# Patient Record
Sex: Male | Born: 1990 | Race: White | Hispanic: No | Marital: Married | Smoking: Current every day smoker
Health system: Southern US, Community
[De-identification: ages and names within clinical notes are randomized; demographics above are authoritative.]

## PROBLEM LIST (undated history)

## (undated) HISTORY — PX: TONSILLECTOMY: SUR1361

---
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
## 2013-07-16 ENCOUNTER — Emergency Department (HOSPITAL_BASED_OUTPATIENT_CLINIC_OR_DEPARTMENT_OTHER): Payer: BC Managed Care – PPO

## 2013-07-16 ENCOUNTER — Encounter (HOSPITAL_BASED_OUTPATIENT_CLINIC_OR_DEPARTMENT_OTHER): Payer: Self-pay | Admitting: *Deleted

## 2013-07-16 ENCOUNTER — Emergency Department (HOSPITAL_BASED_OUTPATIENT_CLINIC_OR_DEPARTMENT_OTHER)
Admission: EM | Admit: 2013-07-16 | Discharge: 2013-07-16 | Disposition: A | Discharge status: Home / Self Care | Payer: BC Managed Care – PPO | Attending: Emergency Medicine | Admitting: Emergency Medicine

## 2013-07-16 DIAGNOSIS — F17200 Nicotine dependence, unspecified, uncomplicated: Secondary | ICD-10-CM | POA: Insufficient documentation

## 2013-07-16 DIAGNOSIS — Y929 Unspecified place or not applicable: Secondary | ICD-10-CM | POA: Insufficient documentation

## 2013-07-16 DIAGNOSIS — S62309A Unspecified fracture of unspecified metacarpal bone, initial encounter for closed fracture: Secondary | ICD-10-CM | POA: Insufficient documentation

## 2013-07-16 DIAGNOSIS — W2209XA Striking against other stationary object, initial encounter: Secondary | ICD-10-CM | POA: Insufficient documentation

## 2013-07-16 DIAGNOSIS — Y939 Activity, unspecified: Secondary | ICD-10-CM | POA: Insufficient documentation

## 2013-07-16 MED ORDER — HYDROCODONE-ACETAMINOPHEN 5-325 MG PO TABS: 2.0000 | ORAL_TABLET | ORAL | Status: AC | PRN
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
## 2013-07-16 NOTE — ED Notes (Signed)
Patient hit wall with R hand today swollen, hurts to touch

## 2015-04-04 IMAGING — CR DG HAND COMPLETE 3+V*R*
3 series · 3 of 3 positions shown · non-contrast
Comparison: None

CLINICAL DATA: Right hand injury and pain.

RIGHT HAND - COMPLETE 3+ VIEW

[x hand pa right]
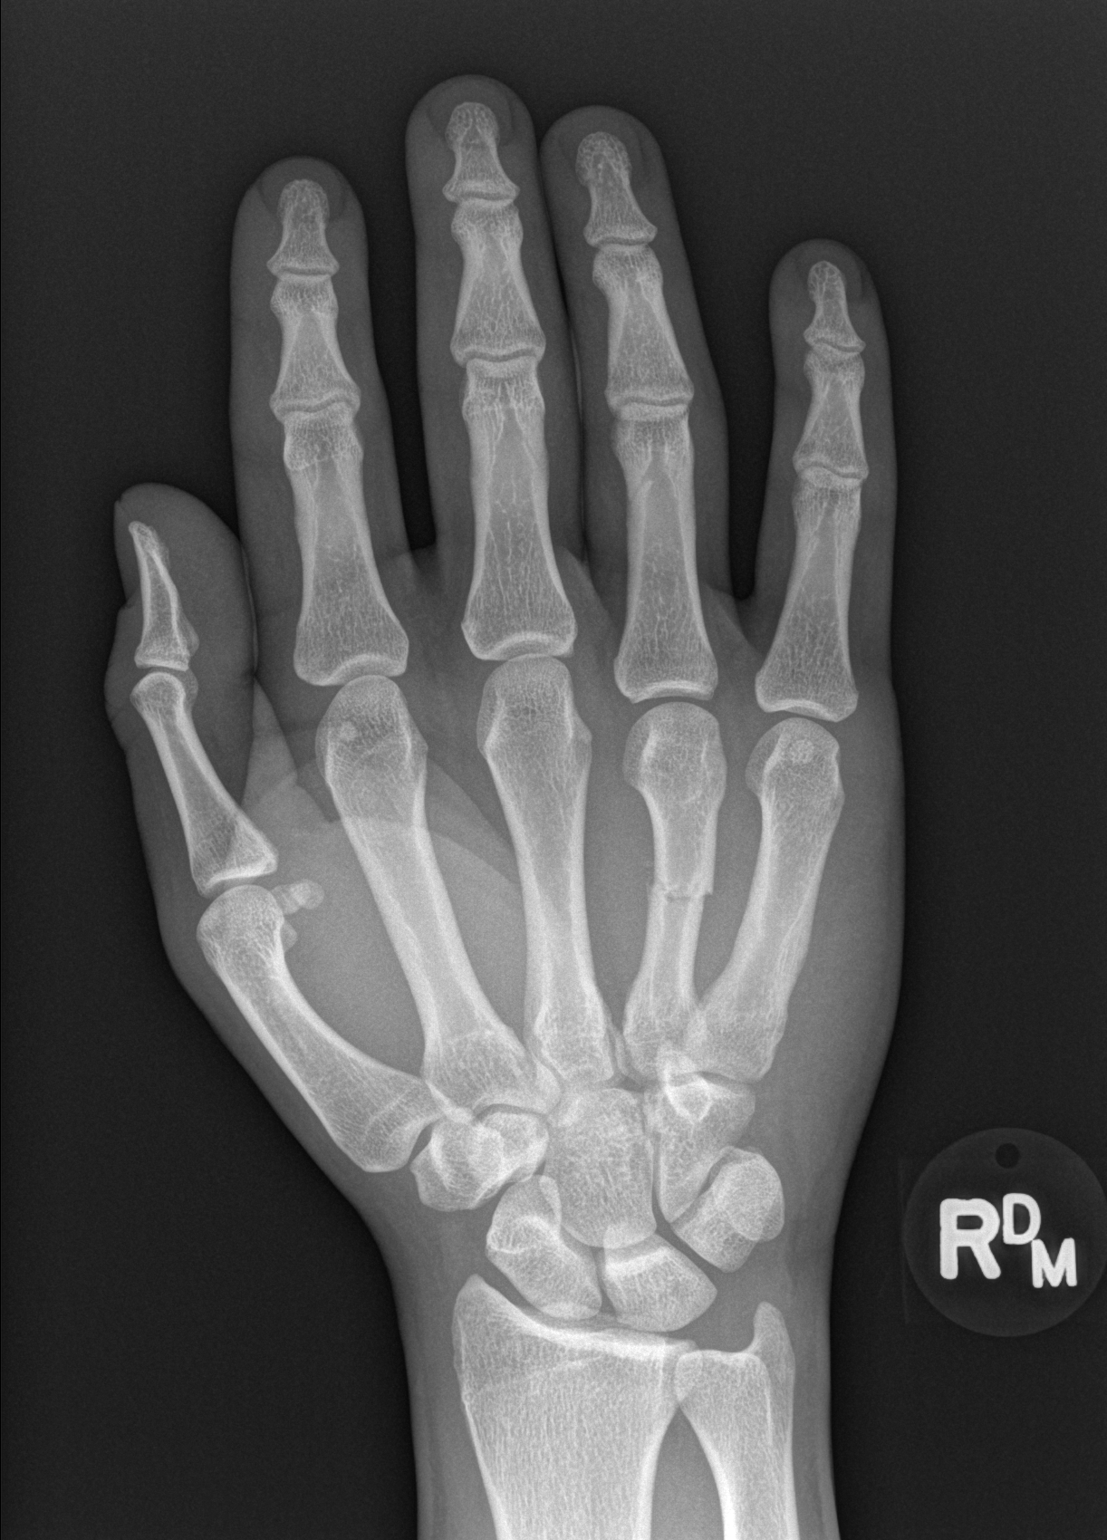

[x hand oblique right]
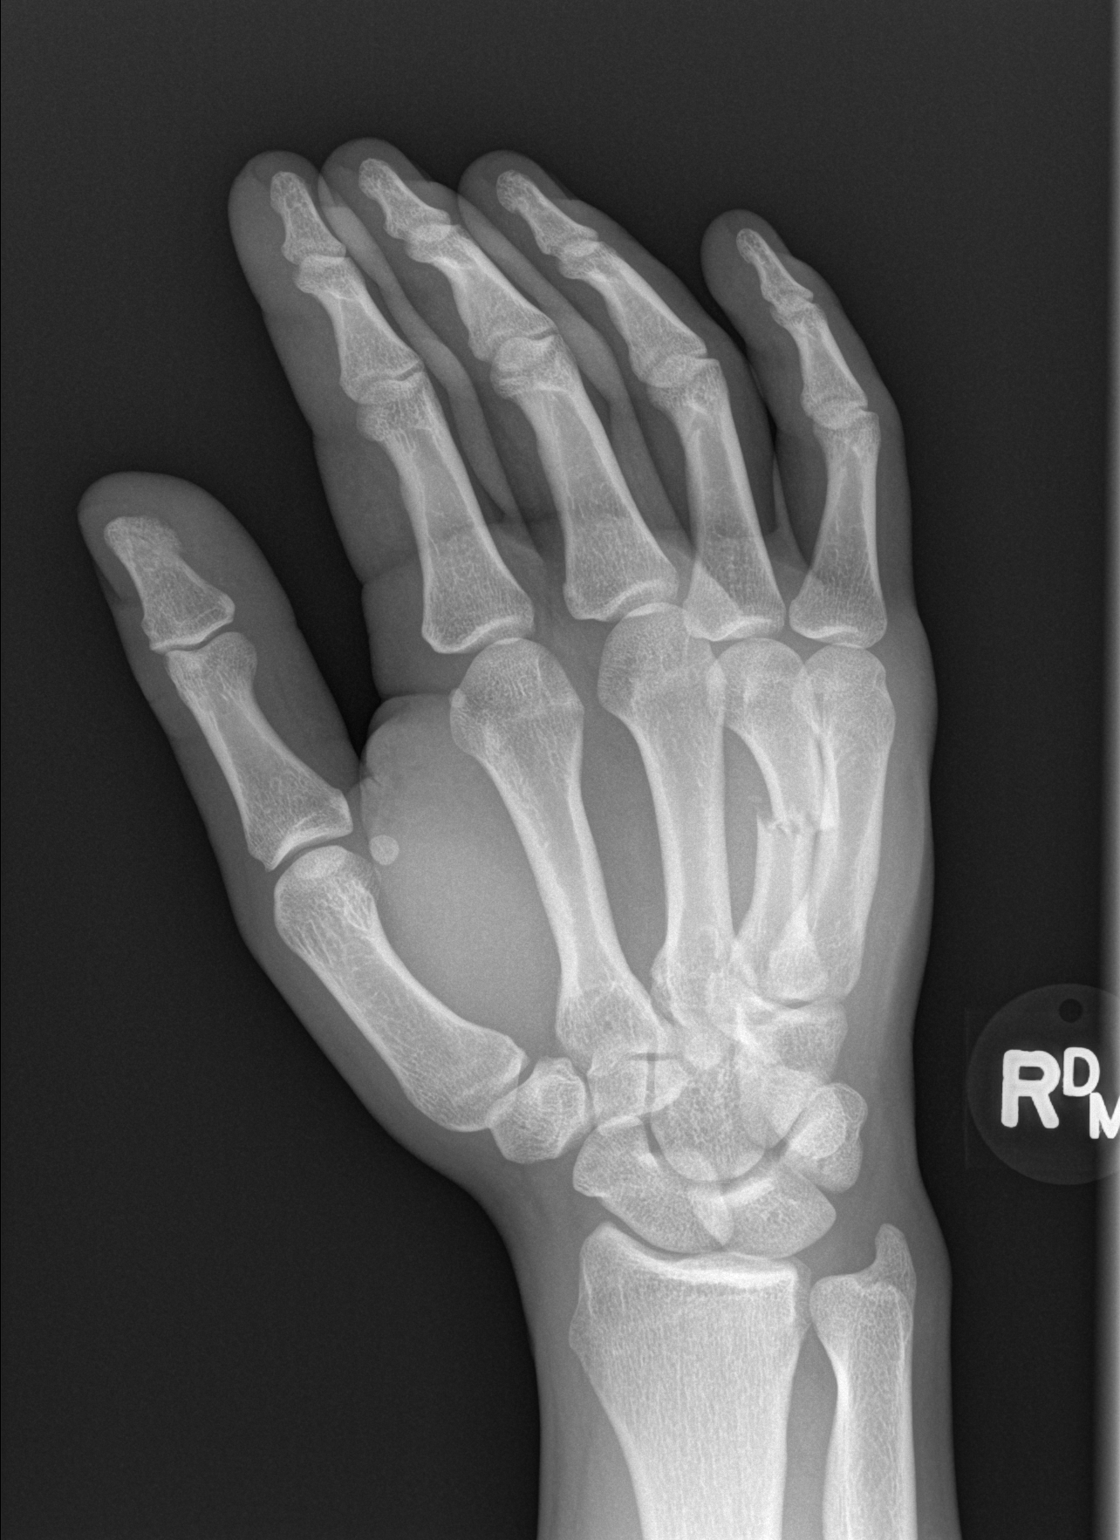

[x hand lat right]
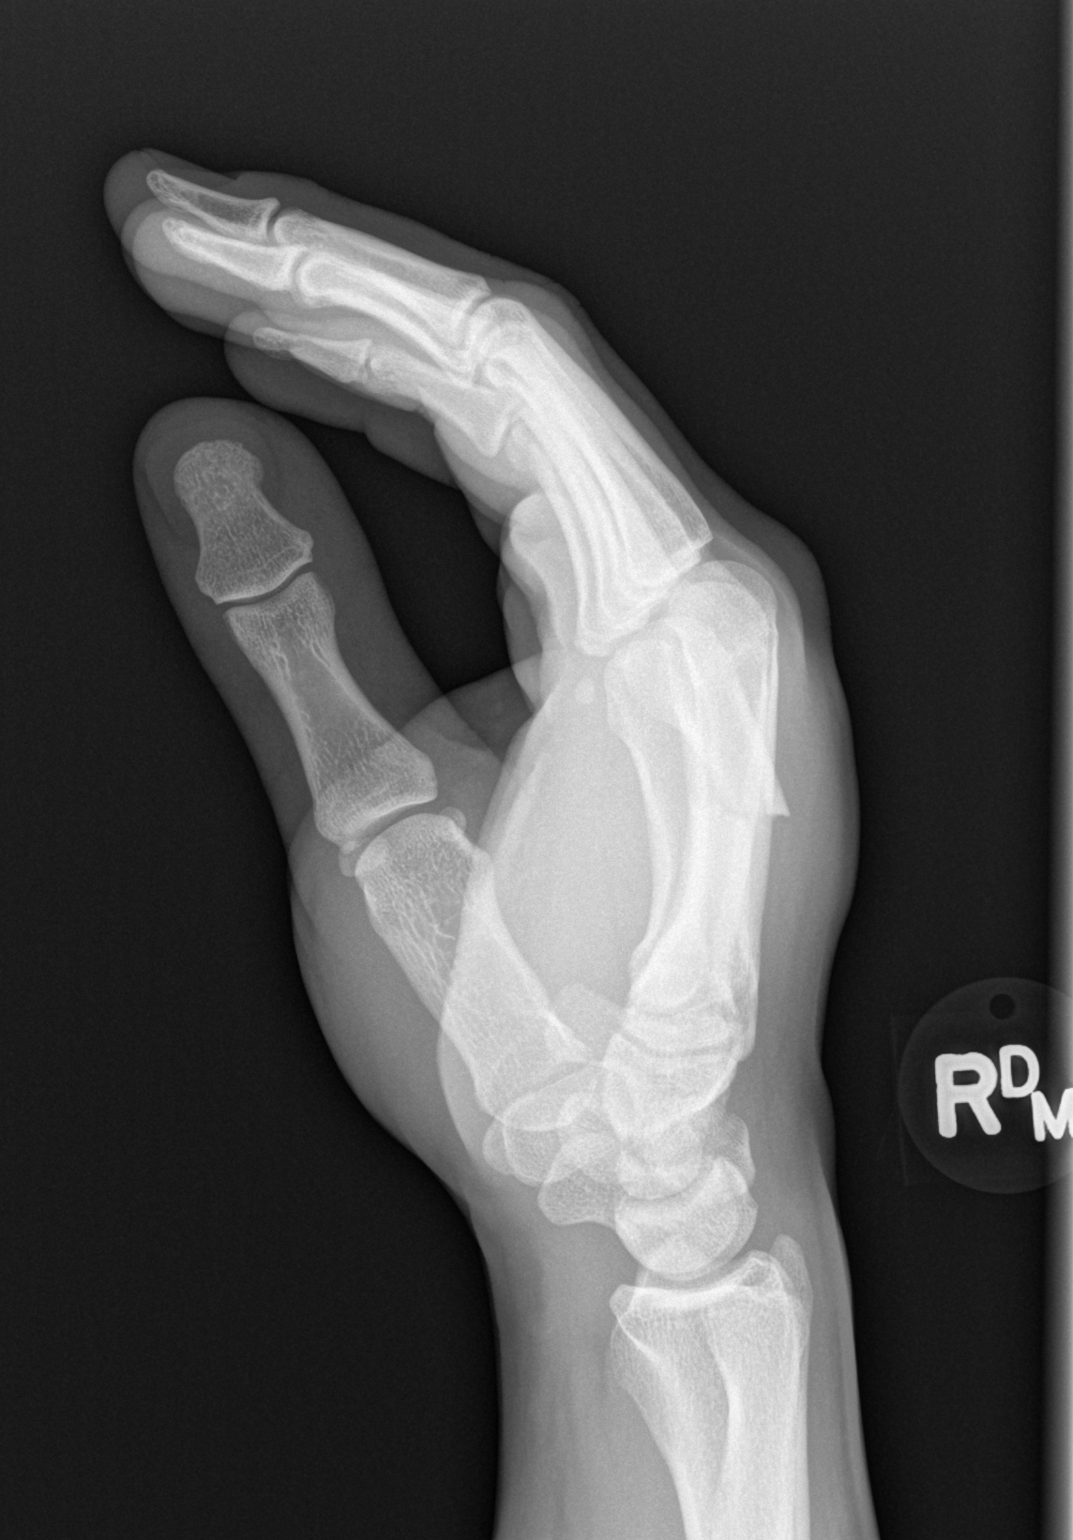

[3 of 3 positions shown; findings below may reference images not displayed]

FINDINGS: A transverse fracture of the mid fourth metacarpal is
noted with 2.5 mm dorsal displacement.
There is no evidence of subluxation or dislocation.
No other fractures are identified.
The joint spaces are unremarkable.
IMPRESSION: Minimally displaced fracture of the mid fourth metacarpal.

## 2022-09-30 ENCOUNTER — Other Ambulatory Visit: Payer: Self-pay

## 2022-09-30 ENCOUNTER — Emergency Department (HOSPITAL_COMMUNITY): Payer: Self-pay

## 2022-09-30 ENCOUNTER — Emergency Department (HOSPITAL_COMMUNITY)
Admission: EM | Admit: 2022-09-30 | Discharge: 2022-09-30 | Disposition: A | Discharge status: Home / Self Care | Payer: Self-pay | Attending: Emergency Medicine | Admitting: Emergency Medicine

## 2022-09-30 ENCOUNTER — Encounter (HOSPITAL_COMMUNITY): Payer: Self-pay

## 2022-09-30 DIAGNOSIS — F17200 Nicotine dependence, unspecified, uncomplicated: Secondary | ICD-10-CM | POA: Insufficient documentation

## 2022-09-30 DIAGNOSIS — R197 Diarrhea, unspecified: Secondary | ICD-10-CM

## 2022-09-30 DIAGNOSIS — A09 Infectious gastroenteritis and colitis, unspecified: Secondary | ICD-10-CM | POA: Insufficient documentation

## 2022-09-30 LAB — COMPREHENSIVE METABOLIC PANEL
ALT: 15 U/L (ref 0–44)
AST: 16 U/L (ref 15–41)
Albumin: 4.4 g/dL (ref 3.5–5.0)
Alkaline Phosphatase: 53 U/L (ref 38–126)
Anion gap: 9 (ref 5–15)
BUN: 13 mg/dL (ref 6–20)
CO2: 20 mmol/L — ABNORMAL LOW (ref 22–32)
Calcium: 9.5 mg/dL (ref 8.9–10.3)
Chloride: 105 mmol/L (ref 98–111)
Creatinine, Ser: 1.05 mg/dL (ref 0.61–1.24)
GFR, Estimated: >60 mL/min (ref >60)
Glucose, Bld: 101 mg/dL — ABNORMAL HIGH (ref 70–99)
Potassium: 4.1 mmol/L (ref 3.5–5.1)
Sodium: 134 mmol/L — ABNORMAL LOW (ref 135–145)
Total Bilirubin: 1.2 mg/dL (ref 0.3–1.2)
Total Protein: 7.2 g/dL (ref 6.5–8.1)

## 2022-09-30 LAB — CBC WITH DIFFERENTIAL/PLATELET
Abs Immature Granulocytes: 0.02 10*3/uL (ref 0.00–0.07)
Basophils Absolute: 0 10*3/uL (ref 0.0–0.1)
Basophils Relative: 0 %
Eosinophils Absolute: 0.4 10*3/uL (ref 0.0–0.5)
Eosinophils Relative: 4 %
HCT: 49.1 % (ref 39.0–52.0)
Hemoglobin: 17.2 g/dL — ABNORMAL HIGH (ref 13.0–17.0)
Immature Granulocytes: 0 %
Lymphocytes Relative: 31 %
Lymphs Abs: 3.2 10*3/uL (ref 0.7–4.0)
MCH: 30.9 pg (ref 26.0–34.0)
MCHC: 35 g/dL (ref 30.0–36.0)
MCV: 88.2 fL (ref 80.0–100.0)
Monocytes Absolute: 0.6 10*3/uL (ref 0.1–1.0)
Monocytes Relative: 6 %
Neutro Abs: 6 10*3/uL (ref 1.7–7.7)
Neutrophils Relative %: 59 %
Platelets: 244 10*3/uL (ref 150–400)
RBC: 5.57 MIL/uL (ref 4.22–5.81)
RDW: 12.6 % (ref 11.5–15.5)
WBC: 10.1 10*3/uL (ref 4.0–10.5)
nRBC: 0 % (ref 0.0–0.2)

## 2022-09-30 LAB — URINALYSIS, ROUTINE W REFLEX MICROSCOPIC
Bilirubin Urine: NEGATIVE
Glucose, UA: NEGATIVE mg/dL
Hgb urine dipstick: NEGATIVE
Ketones, ur: NEGATIVE mg/dL
Leukocytes,Ua: NEGATIVE
Nitrite: NEGATIVE
Protein, ur: NEGATIVE mg/dL
Specific Gravity, Urine: 1.027 (ref 1.005–1.030)
pH: 5 (ref 5.0–8.0)

## 2022-09-30 LAB — LIPASE, BLOOD: Lipase: 24 U/L (ref 11–51)

## 2022-09-30 MED ORDER — CIPROFLOXACIN HCL 500 MG PO TABS: 500.0000 mg | ORAL_TABLET | Freq: Two times a day (BID) | ORAL | 0 refills | Status: AC

## 2022-09-30 MED ORDER — CIPROFLOXACIN HCL 500 MG PO TABS
500.0000 mg | ORAL_TABLET | Freq: Two times a day (BID) | ORAL | 0 refills | Status: DC
  Filled 2022-09-30: qty 10, 5d supply, fill #0

## 2022-09-30 MED ORDER — ONDANSETRON 4 MG PO TBDP
4.0000 mg | ORAL_TABLET | Freq: Once | ORAL | Status: DC
  Filled 2022-09-30: qty 1

## 2022-09-30 MED ORDER — IOHEXOL 350 MG/ML SOLN
75.0000 mL | Freq: Once | INTRAVENOUS | Status: AC | PRN
  Administered 2022-09-30: 75 mL via INTRAVENOUS

## 2022-09-30 NOTE — ED Provider Notes (Signed)
[facility]  CSN: [id number] Arrival date & time: 09/30/22 1126  Chief Complaint(s) Abdominal Pain, Rectal Bleeding, and Emesis  HPI Zachary Ravelingatrick Raatz is a 31 y.o. male with no significant past medical history presenting to the emergency department with abdominal pain.  Patient reports abdominal pain, mainly in his lower abdomen.  He reports associated diarrhea for around the last 5 days, recently also having some blood in his stool.  He reports maroon-colored stools.  No recent travel, raw meat or shellfish, antibiotic use or hospitalizations.  Also reports occasional nausea and some episodes of vomiting.  No hematemesis.  He reports some chills but no fevers.  No dysuria.  Triage note reports tick bite although on further history he has not been bit by ticks since the spring.  He also reports having itchy hives recently, no difficulty breathing, facial swelling, they seem to come and go, he has not had any new pet, food, detergent, clothing, soap exposures.   Past Medical History History reviewed. No pertinent past medical history. There are no problems to display for this patient.  Home Medication(s) Prior to Admission medications   Medication Sig Start Date End Date Taking? Authorizing Provider  ciprofloxacin (CIPRO) 500 MG tablet Take 1 tablet (500 mg total) by mouth 2 (two) times daily for 5 days. 09/30/22 10/05/22  Lonell GrandchildScheving, Barnett Elzey L, MD  HYDROcodone-acetaminophen (NORCO/VICODIN) 5-325 MG per tablet Take 2 tablets by mouth every 4 (four) hours as needed for pain. 07/16/13   Rolland PorterJames, Mark, MD                                                                                                                                    Past Surgical History Past Surgical History:  Procedure Laterality Date   TONSILLECTOMY     Family History History reviewed. No pertinent family history.  Social History Social History   Tobacco Use   Smoking  status: Every Day   Smokeless tobacco: Current    Types: Chew  Substance Use Topics   Alcohol use: Yes   Drug use: No   Allergies Patient has no known allergies.  Review of Systems Review of Systems  All other systems reviewed and are negative.   Physical Exam Vital Signs  I have reviewed the triage vital signs BP (!) 130/103   Pulse 75   Temp 97.7 F (36.5 C) (Oral)   Resp 16   Ht 5\' 8"  (1.727 m)   Wt 95.3 kg   SpO2 99%   BMI 31.95 kg/m  Physical Exam Vitals and nursing note reviewed.  Constitutional:      General: He is not in acute distress.    Appearance: Normal appearance.  HENT:     Mouth/Throat:     Mouth: Mucous membranes are moist.     Comments: No facial swelling Eyes:     Conjunctiva/sclera: Conjunctivae normal.  Cardiovascular:  Rate and Rhythm: Normal rate and regular rhythm.  Pulmonary:     Effort: Pulmonary effort is normal. No respiratory distress.     Breath sounds: Normal breath sounds. No wheezing.  Abdominal:     General: Abdomen is flat.     Palpations: Abdomen is soft.     Tenderness: There is no abdominal tenderness.  Musculoskeletal:     Right lower leg: No edema.     Left lower leg: No edema.  Skin:    General: Skin is warm and dry.     Capillary Refill: Capillary refill takes less than 2 seconds.     Comments: Scattered urticaria to the bilateral arms  Neurological:     Mental Status: He is alert and oriented to person, place, and time. Mental status is at baseline.  Psychiatric:        Mood and Affect: Mood normal.        Behavior: Behavior normal.     ED Results and Treatments Labs (all labs ordered are listed, but only abnormal results are displayed) Labs Reviewed  COMPREHENSIVE METABOLIC PANEL - Abnormal; Notable for the following components:      Result Value   Sodium 134 (*)    CO2 20 (*)    Glucose, Bld 101 (*)    All other components within normal limits  CBC WITH DIFFERENTIAL/PLATELET - Abnormal; Notable  for the following components:   Hemoglobin 17.2 (*)    All other components within normal limits  GASTROINTESTINAL PANEL BY PCR, STOOL (REPLACES STOOL CULTURE)  LIPASE, BLOOD  URINALYSIS, ROUTINE W REFLEX MICROSCOPIC                                                                                                                          Radiology DG Chest 2 View  Result Date: 09/30/2022 CLINICAL DATA:  Cough EXAM: CHEST - 2 VIEW COMPARISON:  11/28/2011 FINDINGS: The heart size and mediastinal contours are within normal limits. Both lungs are clear. The visualized skeletal structures are unremarkable. IMPRESSION: No active cardiopulmonary disease. Electronically Signed   By: Jasmine Pang M.D.   On: 09/30/2022 19:47   CT ABDOMEN PELVIS W CONTRAST  Result Date: 09/30/2022 CLINICAL DATA:  Acute generalized abdominal pain, rectal bleeding. EXAM: CT ABDOMEN AND PELVIS WITH CONTRAST TECHNIQUE: Multidetector CT imaging of the abdomen and pelvis was performed using the standard protocol following bolus administration of intravenous contrast. RADIATION DOSE REDUCTION: This exam was performed according to the departmental dose-optimization program which includes automated exposure control, adjustment of the mA and/or kV according to patient size and/or use of iterative reconstruction technique. CONTRAST:  53mL OMNIPAQUE IOHEXOL 350 MG/ML SOLN COMPARISON:  October 21, 2009. FINDINGS: Lower chest: No acute abnormality. Hepatobiliary: No focal liver abnormality is seen. No gallstones, gallbladder wall thickening, or biliary dilatation. Pancreas: Unremarkable. No pancreatic ductal dilatation or surrounding inflammatory changes. Spleen: Normal in size without focal abnormality. Adrenals/Urinary Tract: Adrenal glands are unremarkable. Kidneys are normal, without renal  calculi, focal lesion, or hydronephrosis. Bladder is unremarkable. Stomach/Bowel: Stomach is within normal limits. Appendix appears normal. No  evidence of bowel wall thickening, distention, or inflammatory changes. Vascular/Lymphatic: No significant vascular findings are present. No enlarged abdominal or pelvic lymph nodes. Reproductive: Prostate is unremarkable. Other: No abdominal wall hernia or abnormality. No abdominopelvic ascites. Musculoskeletal: No acute or significant osseous findings. IMPRESSION: No definite abnormality seen in the abdomen or pelvis. Electronically Signed   By: Lupita Raider M.D.   On: 09/30/2022 16:27    Pertinent labs & imaging results that were available during my care of the patient were reviewed by me and considered in my medical decision making (see MDM for details).  Medications Ordered in ED Medications  ondansetron (ZOFRAN-ODT) disintegrating tablet 4 mg (0 mg Oral Hold 09/30/22 1422)  iohexol (OMNIPAQUE) 350 MG/ML injection 75 mL (75 mLs Intravenous Contrast Given 09/30/22 1620)                                                                                                                                     Procedures Procedures  (including critical care time)  Medical Decision Making / ED Course   MDM:  31 year old male presenting to the emergency department with diarrhea.  Patient overall well-appearing.  Physical exam reassuring.  Some urticaria but otherwise reassuring exam.  Unclear cause of symptoms, could represent infectious diarrhea.  He does report camping and being outside, given blood in his stool and length of symptoms will treat with Cipro.  Labs overall reassuring without evidence of large blood loss.  Doubt ischemic colitis given age and length of symptoms.  Less likely inflammatory bowel disease but patient has had some chronic abdominal pain.  Will refer to gastroenterology.  Patient unable to provide stool sample for PCR testing.  Patient reports cough, likely due to smoking, his lungs are clear and his chest x-ray does not show any pneumonia.  Advised smoking  cessation.  unclear cause of hives, no clear trigger.  No secondary signs of anaphylactic reaction or angioedema.  Could be related to underlying illness.  Discussed symptomatic relief with Benadryl.  Patient does not have primary doctor so advised establishing care with primary doctor.  Will discharge patient to home. All questions answered. Patient comfortable with plan of discharge. Return precautions discussed with patient and specified on the after visit summary.       Additional history obtained: -Additional history obtained from spouse -External records from outside source obtained and reviewed including: Chart review including previous notes, labs, imaging, consultation notes including last ED visit 07/16/13   Lab Tests: -I ordered, reviewed, and interpreted labs.   The pertinent results include:   Labs Reviewed  COMPREHENSIVE METABOLIC PANEL - Abnormal; Notable for the following components:      Result Value   Sodium 134 (*)    CO2 20 (*)    Glucose, Bld 101 (*)  All other components within normal limits  CBC WITH DIFFERENTIAL/PLATELET - Abnormal; Notable for the following components:   Hemoglobin 17.2 (*)    All other components within normal limits  GASTROINTESTINAL PANEL BY PCR, STOOL (REPLACES STOOL CULTURE)  LIPASE, BLOOD  URINALYSIS, ROUTINE W REFLEX MICROSCOPIC    Notable for normal hgb, very mild hyponatremia  Imaging Studies ordered: I ordered imaging studies including CT A/P On my interpretation imaging demonstrates no acute process I independently visualized and interpreted imaging. I agree with the radiologist interpretation   Medicines ordered and prescription drug management: Meds ordered this encounter  Medications   ondansetron (ZOFRAN-ODT) disintegrating tablet 4 mg   iohexol (OMNIPAQUE) 350 MG/ML injection 75 mL   DISCONTD: ciprofloxacin (CIPRO) 500 MG tablet    Sig: Take 1 tablet (500 mg total) by mouth 2 (two) times daily for 5 days.     Dispense:  10 tablet    Refill:  0   ciprofloxacin (CIPRO) 500 MG tablet    Sig: Take 1 tablet (500 mg total) by mouth 2 (two) times daily for 5 days.    Dispense:  10 tablet    Refill:  0    -I have reviewed the patients home medicines and have made adjustments as needed  Social Determinants of Health:  Diagnosis or treatment significantly limited by social determinants of health: current smoker   Reevaluation: After the interventions noted above, I reevaluated the patient and found that they have improved  Co morbidities that complicate the patient evaluation History reviewed. No pertinent past medical history.    Dispostion: Disposition decision including need for hospitalization was considered, and patient discharged from emergency department.    Final Clinical Impression(s) / ED Diagnoses Final diagnoses:  Diarrhea of presumed infectious origin     This chart was dictated using voice recognition software.  Despite best efforts to proofread,  errors can occur which can change the documentation meaning.    Lonell Grandchild, MD 09/30/22 [phone]

## 2022-09-30 NOTE — ED Triage Notes (Signed)
  Abdominal pain started Sunday and pain is constant.  Pt. Also having rectal dark bleeding and nausea and vomiting. Also has hives on his body.  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 Mucuos is in the stool .

## 2022-09-30 NOTE — Discharge Instructions (Addendum)
We evaluated you for your diarrhea.  Your lab tests including CT scan and x-ray were all reassuring.  Since you are having some blood in your diarrhea, we have treated you with antibiotics.  Please take these twice a day for 5 days.  Since you have been having gastrointestinal issues for a longer period of time, I would recommend following up with gastroenterology.  I have attached the phone number.  Please call them for follow-up.

## 2022-09-30 NOTE — ED Provider Triage Note (Signed)
Emergency Medicine Provider Triage Evaluation Note  Zachary Livingston , a 31 y.o. male  was evaluated in triage.  Pt complains of . Abdominal pain.  Patient describes months of abdominal pain worse in the evening with associated vomiting.  Over the past 3 to 4 days he has had persistent abdominal pain, nausea, vomiting, bloody and dark stools. Review of Systems  Positive: Epigastric abdominal pain and vomiting Negative: Fever  Physical Exam  BP 125/88 (BP Location: Right Arm)   Pulse (!) 104   Temp 98.3 F (36.8 C) (Oral)   Resp 16   Ht 5\' 8"  (1.727 m)   Wt 95.3 kg   SpO2 97%   BMI 31.95 kg/m  Gen:   Awake, no distress   Resp:  Normal effort  MSK:   Moves extremities without difficulty  Other:    Medical Decision Making  Medically screening exam initiated at 1:25 PM.  Appropriate orders placed.  Zachary Livingston was informed that the remainder of the evaluation will be completed by another provider, this initial triage assessment does not replace that evaluation, and the importance of remaining in the ED until their evaluation is complete.     Earlie Raveling, PA-C 09/30/22 2203

## 2022-09-30 NOTE — ED Notes (Signed)
Patient transported to X-ray 

## 2022-10-01 ENCOUNTER — Other Ambulatory Visit (HOSPITAL_COMMUNITY): Payer: Self-pay
